# Patient Record
Sex: Female | Born: 1960 | Race: Black or African American | Hispanic: No | Marital: Single | Smoking: Never smoker
Health system: Southern US, Community
[De-identification: ages and names within clinical notes are randomized; demographics above are authoritative.]

## PROBLEM LIST (undated history)

## (undated) DIAGNOSIS — I10 Essential (primary) hypertension: Secondary | ICD-10-CM

## (undated) HISTORY — PX: ABDOMINAL HYSTERECTOMY: SHX81

## (undated) HISTORY — DX: Essential (primary) hypertension: I10

---

## 2004-07-17 ENCOUNTER — Ambulatory Visit: Payer: Self-pay | Admitting: General Practice

## 2005-07-17 ENCOUNTER — Ambulatory Visit: Payer: Self-pay | Admitting: General Practice

## 2005-07-31 ENCOUNTER — Ambulatory Visit: Payer: Self-pay | Admitting: General Practice

## 2006-07-23 ENCOUNTER — Ambulatory Visit: Payer: Self-pay | Admitting: Endocrinology

## 2006-11-18 ENCOUNTER — Ambulatory Visit: Payer: Self-pay | Admitting: Endocrinology

## 2010-02-08 ENCOUNTER — Ambulatory Visit: Payer: Self-pay

## 2010-02-28 ENCOUNTER — Ambulatory Visit: Payer: Self-pay

## 2010-06-20 ENCOUNTER — Observation Stay: Payer: Self-pay | Admitting: Internal Medicine

## 2011-07-10 ENCOUNTER — Ambulatory Visit: Payer: Self-pay

## 2012-06-11 IMAGING — CR DG CHEST 1V PORT
1 series · 1 of 1 positions shown · non-contrast
Comparison: none

REASON FOR EXAM: cp
COMMENTS:

[view not recorded]
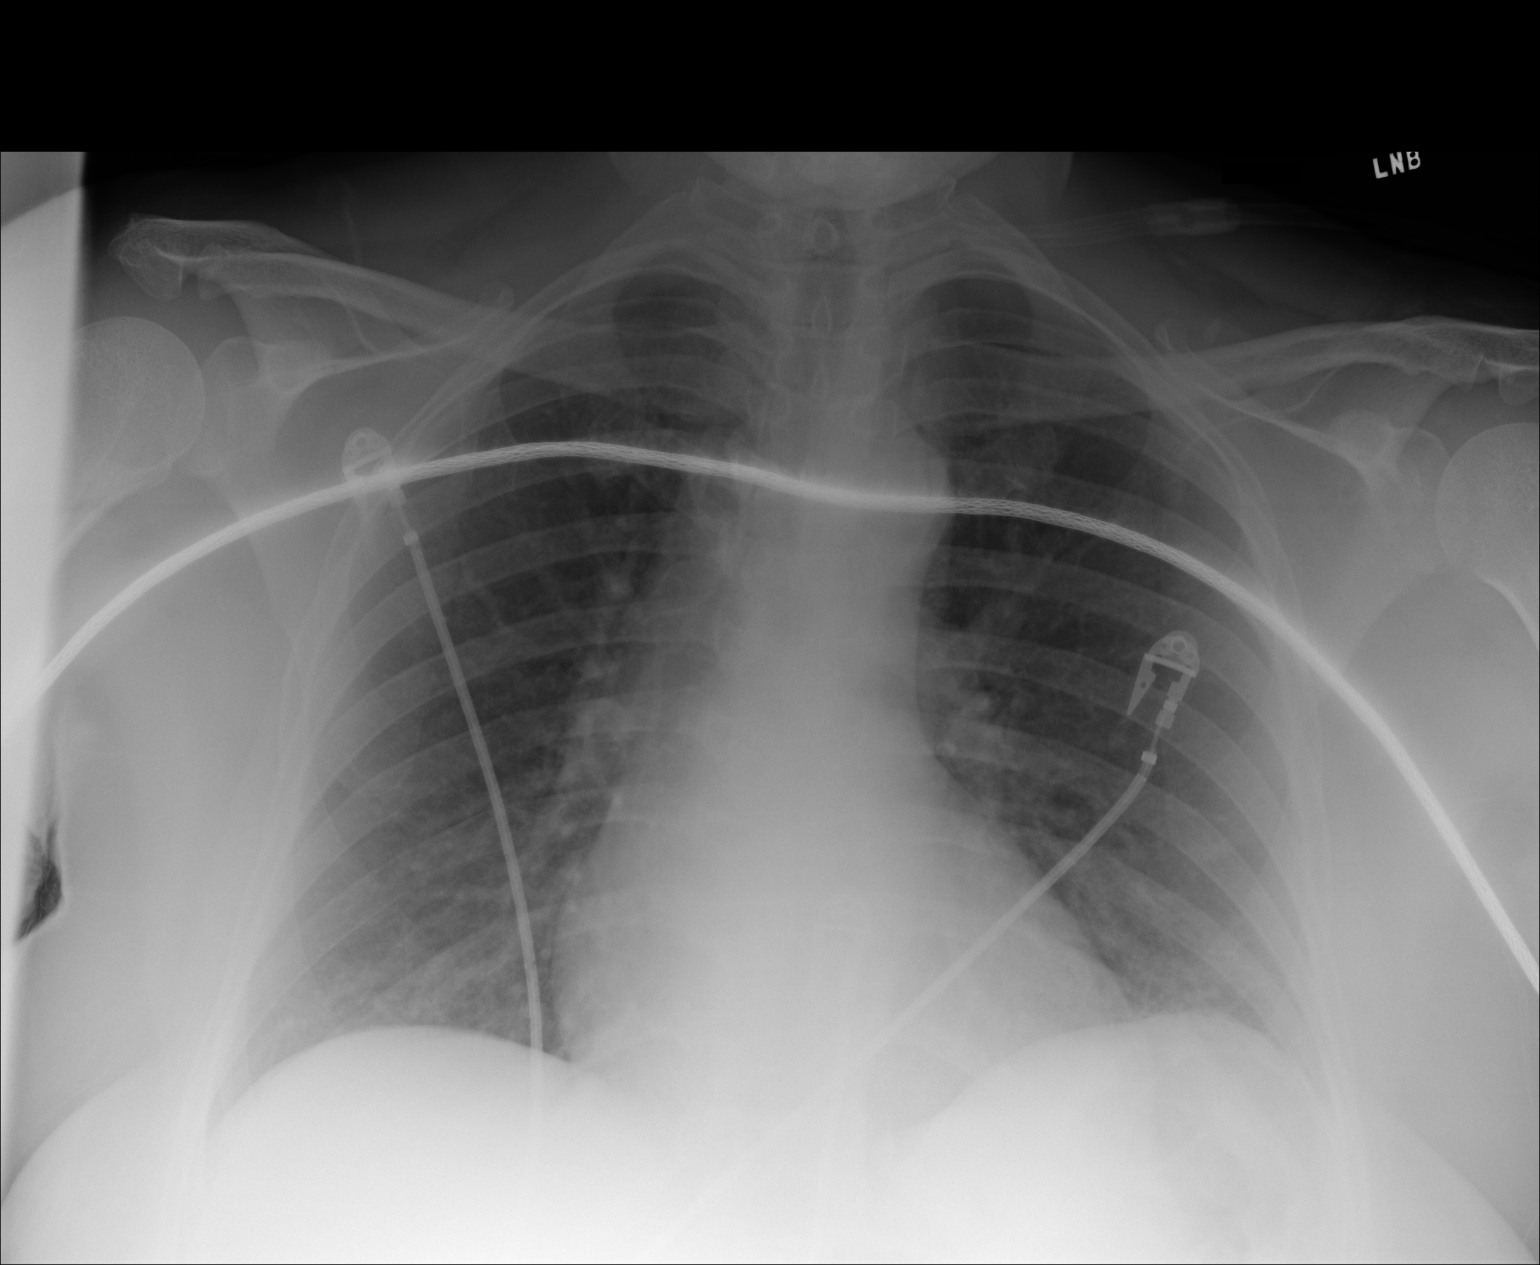

[1 of 1 positions shown; findings below may reference images not displayed]

PROCEDURE:     DXR - DXR PORTABLE CHEST SINGLE VIEW  - June 20, 2010  [DATE]

RESULT:     There is no previous exam for comparison.

Cardiac monitoring electrodes are present. The lungs are clear. The heart
and pulmonary vessels are normal. The bony and mediastinal structures are
unremarkable. There is no effusion. There is no pneumothorax or evidence of
congestive failure.
IMPRESSION: No acute cardiopulmonary disease.

## 2012-07-23 ENCOUNTER — Ambulatory Visit: Payer: Self-pay | Admitting: Nurse Practitioner

## 2012-08-05 ENCOUNTER — Ambulatory Visit: Payer: Self-pay | Admitting: Nurse Practitioner

## 2014-12-27 ENCOUNTER — Ambulatory Visit: Payer: Self-pay

## 2015-01-12 ENCOUNTER — Ambulatory Visit: Payer: Self-pay

## 2015-01-17 ENCOUNTER — Ambulatory Visit
Admission: RE | Admit: 2015-01-17 | Discharge: 2015-01-17 | Disposition: A | Discharge status: Home / Self Care | Payer: Self-pay | Source: Ambulatory Visit | Attending: Oncology | Admitting: Oncology

## 2015-01-17 ENCOUNTER — Ambulatory Visit: Payer: Self-pay | Attending: Oncology

## 2015-01-17 VITALS — Ht 66.14 in | Wt 254.6 lb

## 2015-01-17 DIAGNOSIS — Z0000 Encounter for general adult medical examination without abnormal findings: Secondary | ICD-10-CM

## 2015-01-17 NOTE — Progress Notes (Signed)
Subjective:     Patient ID: Katie Vincent, female   DOB: 03-06-1960, 54 y.o.   MRN: [id number]  HPI   Review of Systems     Objective:   Physical Exam  Pulmonary/Chest: Right breast exhibits no inverted nipple, no mass, no nipple discharge, no skin change and no tenderness. Left breast exhibits no inverted nipple, no mass, no nipple discharge, no skin change and no tenderness. Breasts are symmetrical.       Assessment:  [age] year old  [facility].  Patient screened, and meets [facility] eligibility.  Patient does not have insurance, Medicare or Medicaid.  Handout given on Affordable Care Act.   Instructed patient on breast self-exam using teach back method.  CBE unremarkable.  Patient works at [employer].    Plan:     Sent for bilateral screening mammogram.

## 2015-01-26 NOTE — Progress Notes (Signed)
Letter mailed from [facility] to notify of normal mammogram results.  Patient to return in one year for annual screening.  Copy to [facility]. 

## 2017-01-08 IMAGING — MG MM DIGITAL SCREENING BILATERAL
5 series · 5 of 5 positions shown · non-contrast
Comparison: Previous exam(s).

CLINICAL DATA: Screening.

EXAM:
DIGITAL SCREENING BILATERAL MAMMOGRAM WITH CAD

[R CC (1 of 2)]
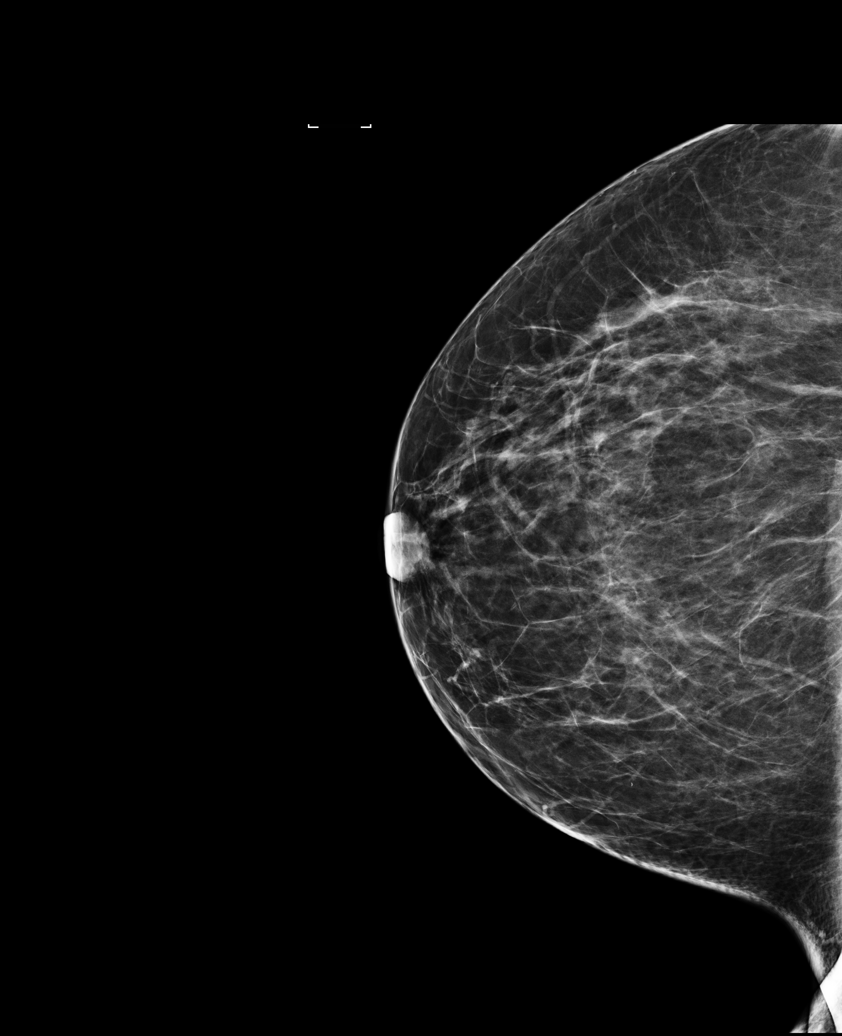

[R CC (2 of 2)]
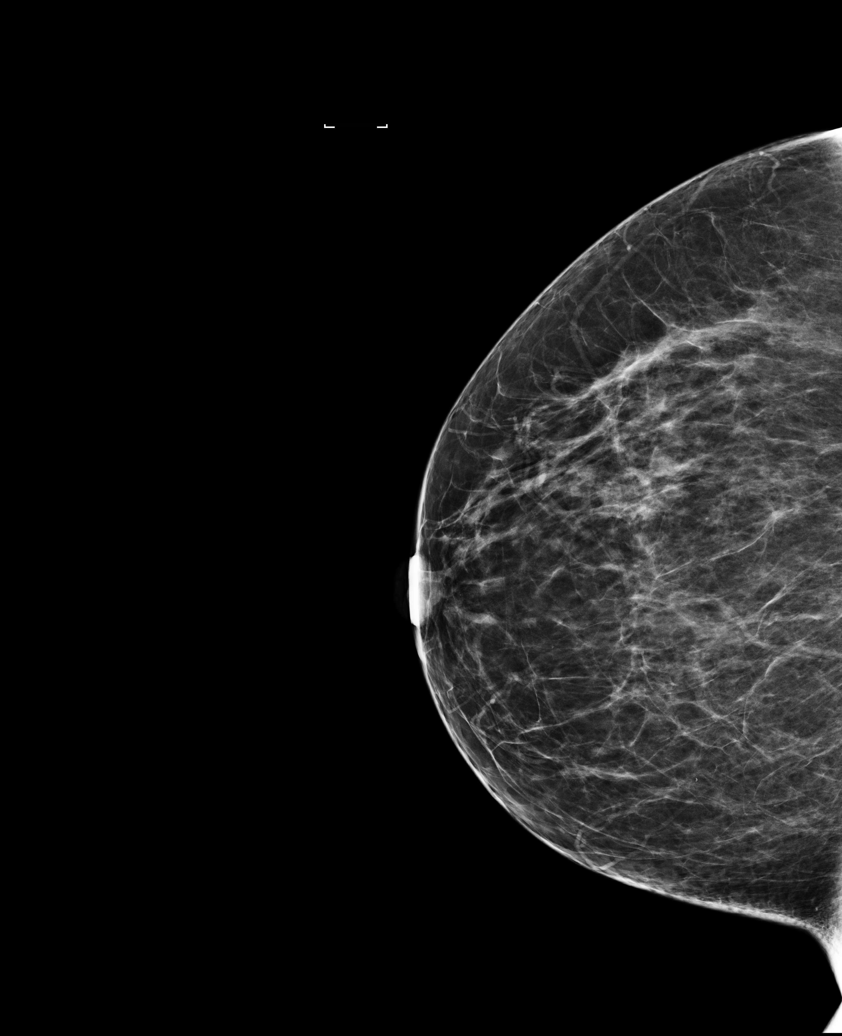

[R MLO]
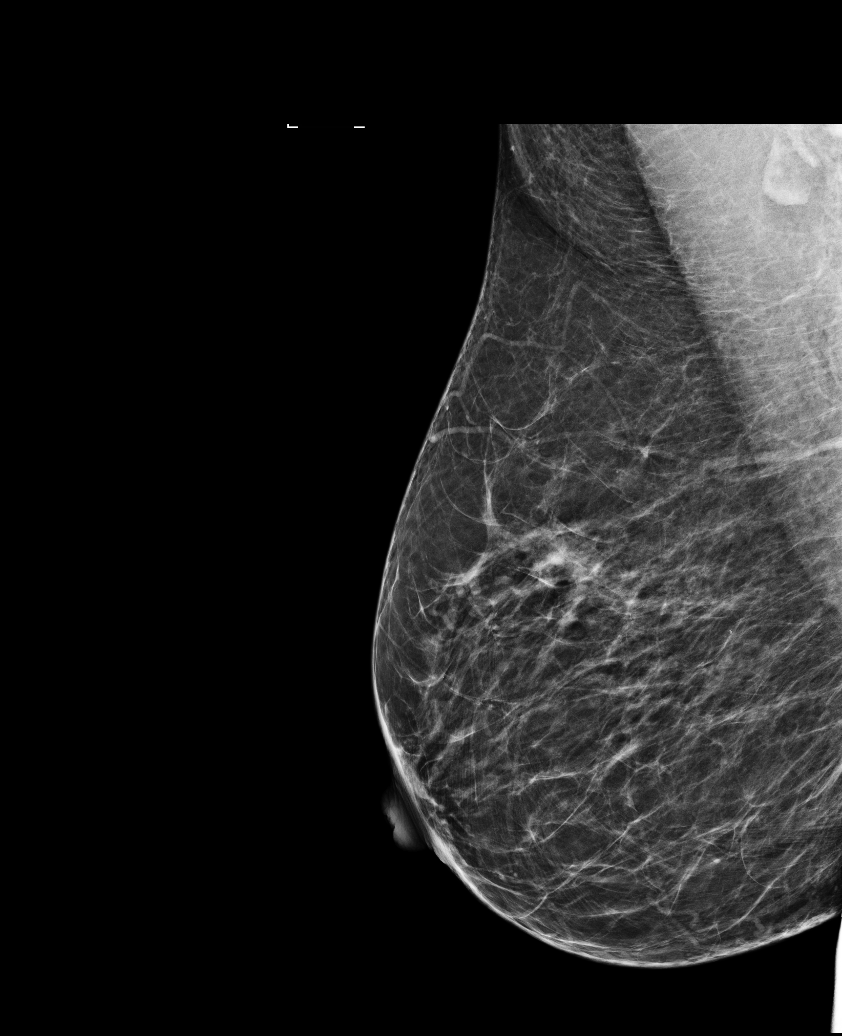

[L CC]
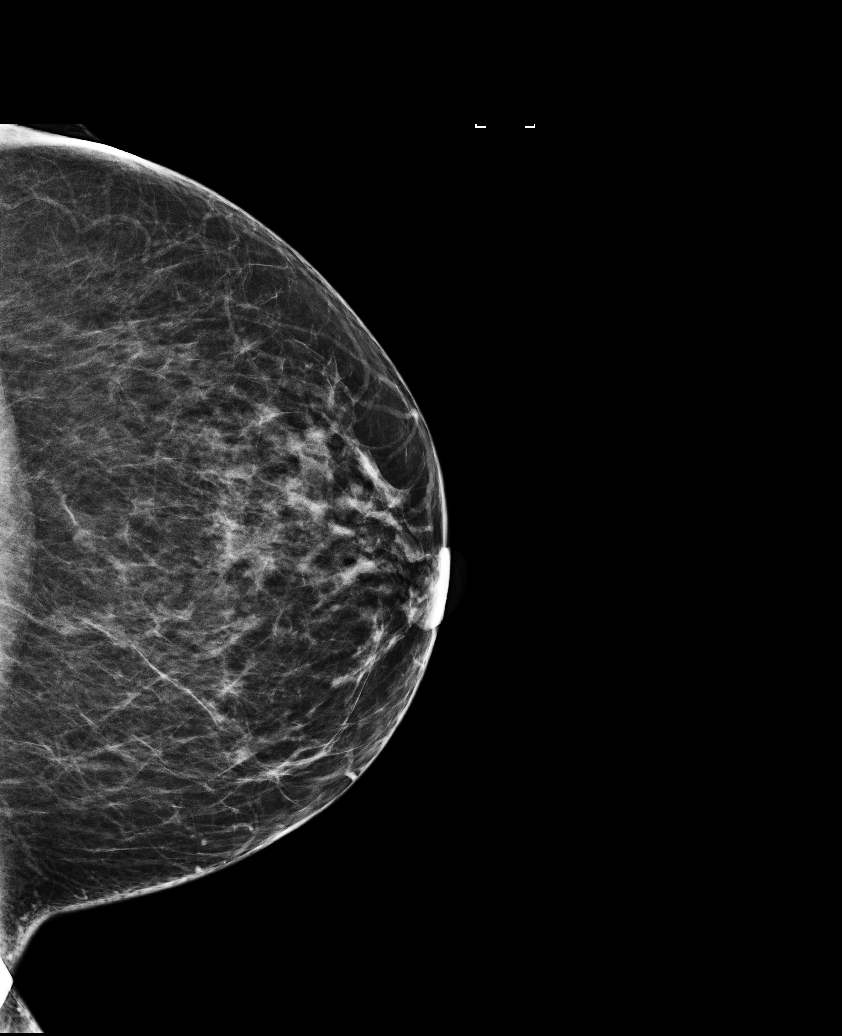

[L MLO]
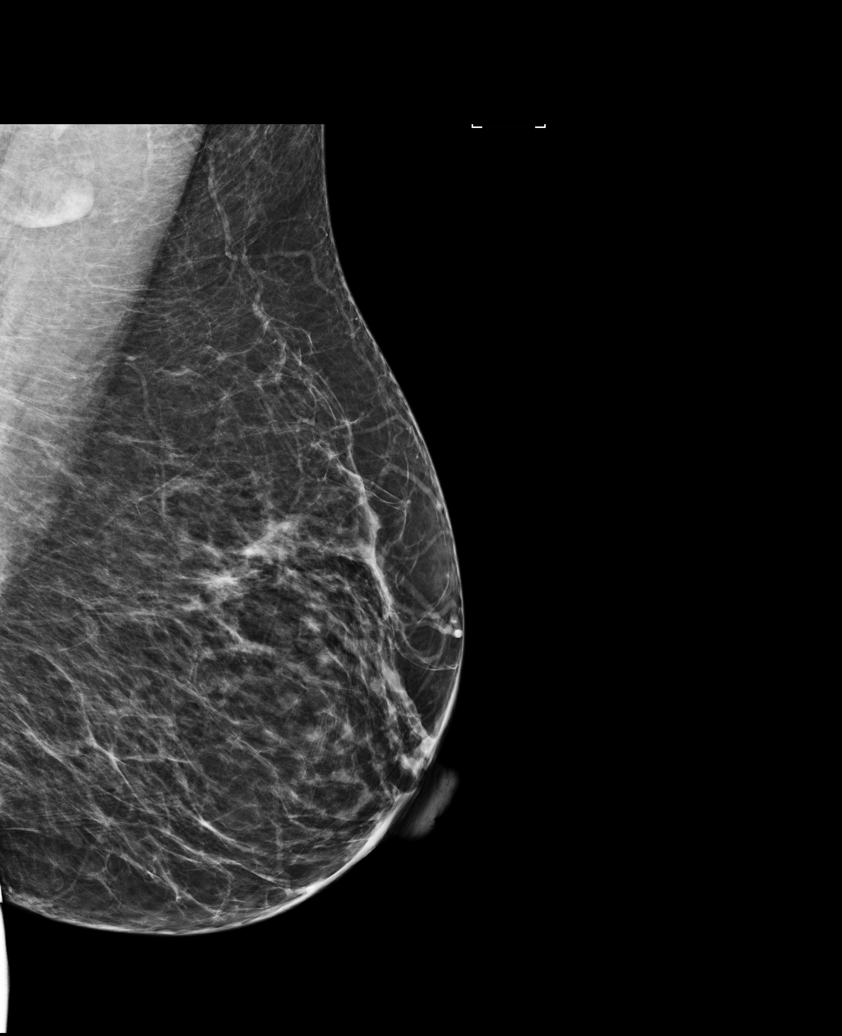

[5 of 5 positions shown; findings below may reference images not displayed]

ACR Breast Density Category c: The breast tissue is heterogeneously
dense, which may obscure small masses.
FINDINGS: There are no findings suspicious for malignancy. Images were
processed with CAD.
IMPRESSION: No mammographic evidence of malignancy. A result letter of this
screening mammogram will be mailed directly to the patient.

RECOMMENDATION:
Screening mammogram in one year. (Code:YJ-[id number])

BI-RADS CATEGORY  1: Negative.

## 2023-04-04 ENCOUNTER — Other Ambulatory Visit: Payer: Self-pay | Admitting: *Deleted

## 2023-04-04 ENCOUNTER — Inpatient Hospital Stay
Admission: RE | Admit: 2023-04-04 | Discharge: 2023-04-04 | Disposition: A | Discharge status: Home / Self Care | Payer: Self-pay | Source: Ambulatory Visit | Attending: Physician Assistant | Admitting: Physician Assistant

## 2023-04-04 DIAGNOSIS — Z1231 Encounter for screening mammogram for malignant neoplasm of breast: Secondary | ICD-10-CM

## 2023-04-25 ENCOUNTER — Other Ambulatory Visit: Payer: Self-pay

## 2023-04-25 DIAGNOSIS — Z1231 Encounter for screening mammogram for malignant neoplasm of breast: Secondary | ICD-10-CM

## 2023-05-20 ENCOUNTER — Ambulatory Visit: Payer: Self-pay | Attending: Hematology and Oncology | Admitting: Hematology and Oncology

## 2023-05-20 ENCOUNTER — Ambulatory Visit
Admission: RE | Admit: 2023-05-20 | Discharge: 2023-05-20 | Disposition: A | Discharge status: Home / Self Care | Payer: Self-pay | Source: Ambulatory Visit | Attending: Obstetrics and Gynecology | Admitting: Obstetrics and Gynecology

## 2023-05-20 VITALS — BP 141/78 | Wt 211.0 lb

## 2023-05-20 DIAGNOSIS — Z1231 Encounter for screening mammogram for malignant neoplasm of breast: Secondary | ICD-10-CM | POA: Insufficient documentation

## 2023-05-20 NOTE — Progress Notes (Signed)
 [name] is a 63 y.o. female who presents to [facility] clinic today with no complaints.    Pap Smear: Pap not smear completed today due to hysterectomy in 2000 for benign fibroids.   Physical exam: Breasts Breasts symmetrical. No skin abnormalities bilateral breasts. No nipple retraction bilateral breasts. No nipple discharge bilateral breasts. No lymphadenopathy. No lumps palpated bilateral breasts.       Pelvic/Bimanual Pap is not indicated today    Smoking History: Patient has is a former smoker having quit in 2021 and was not referred to quit line.    Patient Navigation: Patient education provided. Access to services provided for patient through [facility] program. No interpreter provided. No transportation provided   Colorectal Cancer Screening: Per patient has never had colonoscopy completed No complaints today. Appointment for Cologuard with Katie Vincent in June 2025   Breast and Cervical Cancer Risk Assessment: Patient does not have family history of breast cancer, known genetic mutations, or radiation treatment to the chest before age 64. Patient does not have history of cervical dysplasia, immunocompromised, or DES exposure in-utero.  Risk Scores as of Encounter on 05/20/2023     Katie Vincent           5-year 1.54%   Lifetime 6.53%            Last calculated by Katie Ingle, LPN on 8/46/9629 at  8:22 AM        A: [facility] exam without pap smear No complaints with benign exam.   P: Referred patient to the [facility] for a screening mammogram. Appointment scheduled 05/20/2023.  Adelaide Adjutant, NP 05/20/2023 8:32 AM

## 2023-05-20 NOTE — Patient Instructions (Signed)
 Taught Katie Vincent about self breast awareness and gave educational materials to take home. Patient did not need a Pap smear today due to benign hysterectomy in 2000. Referred patient to the [facility] for screening mammogram. Appointment scheduled for 05/20/2023. Patient aware of appointment and will be there. Let patient know will follow up with her within the next couple weeks with results. Katie Vincent verbalized understanding.  Adelaide Adjutant, NP 8:37 AM
